# Patient Record
Sex: Female | Born: 1985 | Race: White | Hispanic: No | Marital: Single | Smoking: Never smoker
Health system: Southern US, Community
[De-identification: ages and names within clinical notes are randomized; demographics above are authoritative.]

---
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
## 2005-11-01 ENCOUNTER — Other Ambulatory Visit: Admission: RE | Admit: 2005-11-01 | Discharge: 2005-11-01 | Payer: Self-pay | Admitting: Gynecology

## 2007-03-25 ENCOUNTER — Other Ambulatory Visit: Admission: RE | Admit: 2007-03-25 | Discharge: 2007-03-25 | Payer: Self-pay | Admitting: Gynecology

## 2008-10-19 ENCOUNTER — Encounter: Admission: RE | Admit: 2008-10-19 | Discharge: 2008-10-19 | Payer: Self-pay | Admitting: Gynecology

## 2009-07-18 ENCOUNTER — Encounter: Admission: RE | Admit: 2009-07-18 | Discharge: 2009-07-18 | Payer: Self-pay | Admitting: Emergency Medicine

## 2009-08-29 HISTORY — PX: CHOLECYSTECTOMY: SHX55

## 2011-04-04 IMAGING — US US ABDOMEN COMPLETE
1 series · 14 of 25 positions shown · non-contrast
Comparison: None

CLINICAL DATA: Chest pain.

ABDOMINAL ULTRASOUND COMPLETE

[Series 1: us abdomen complete · 0.28mm/px · 14 of 61 slices shown]
[im 1/61]
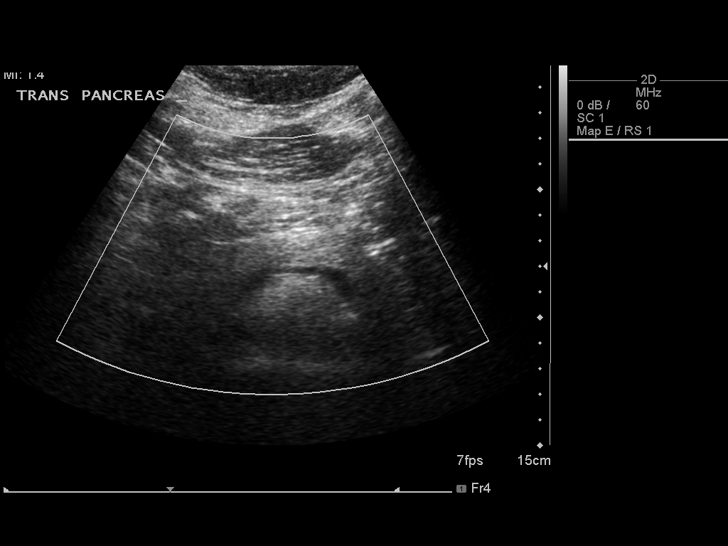
[im 6/61]
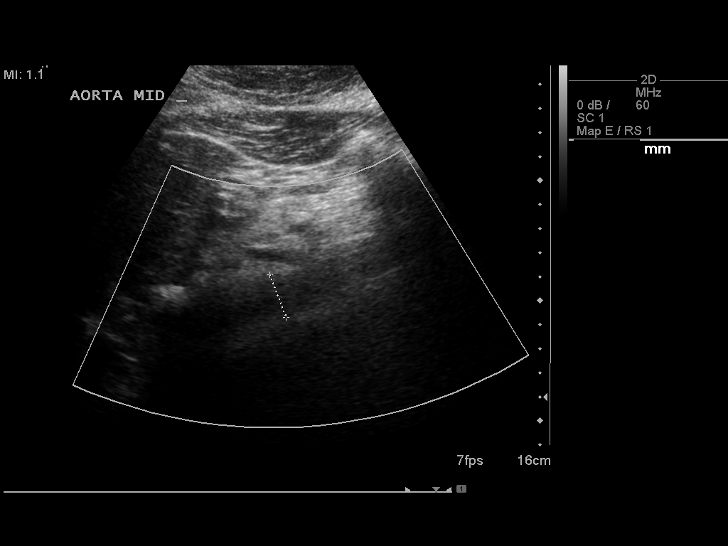
[im 11/61]
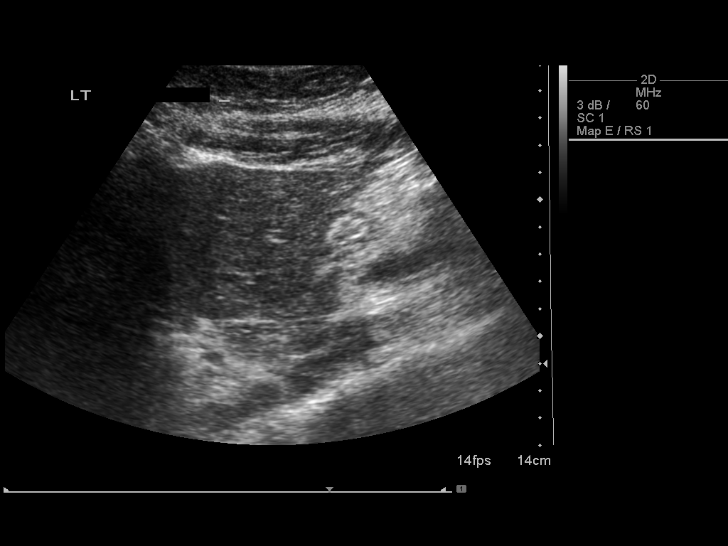
[im 16/61]
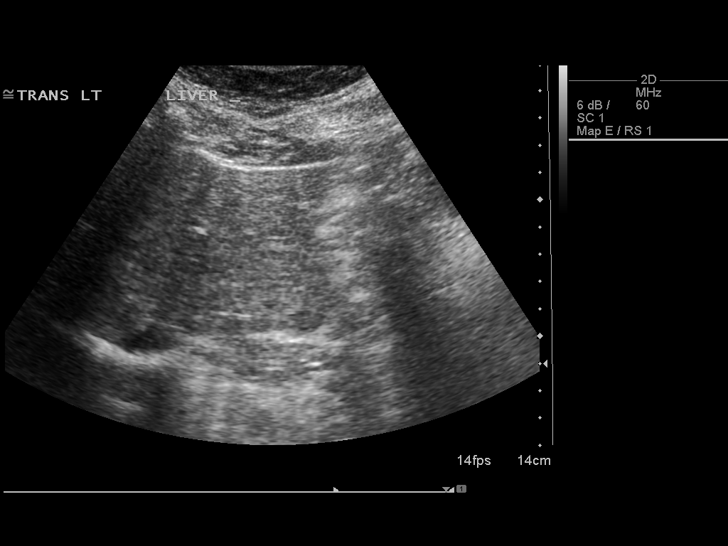
[im 21/61]
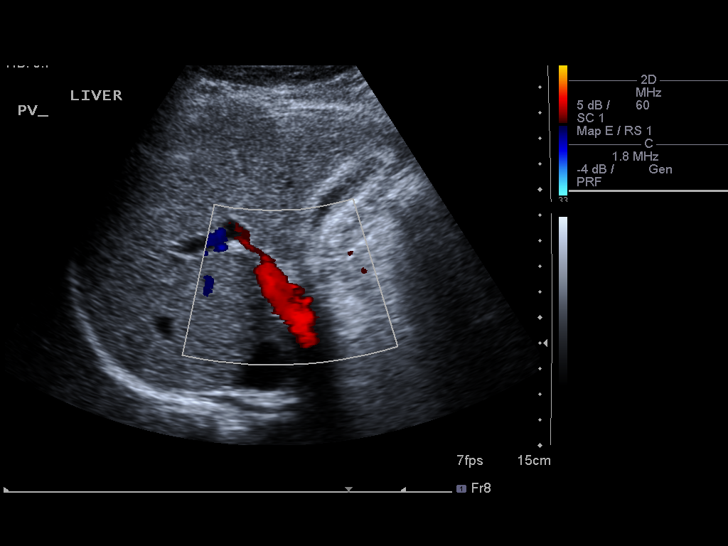
[im 23/61]
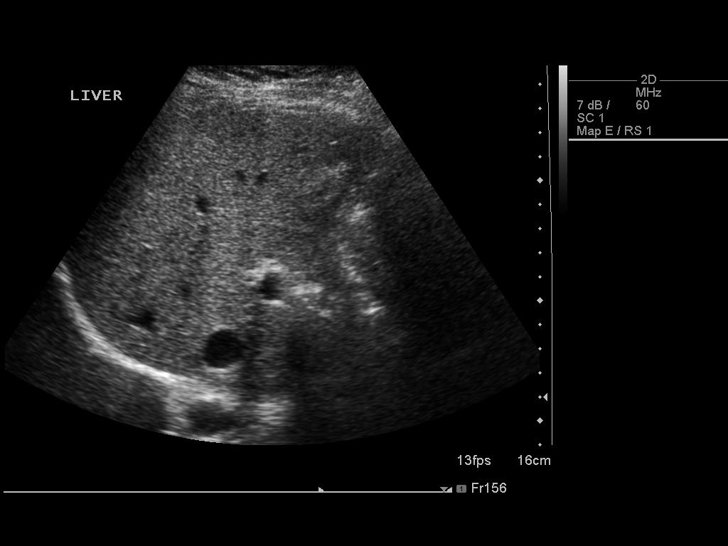
[im 28/61]
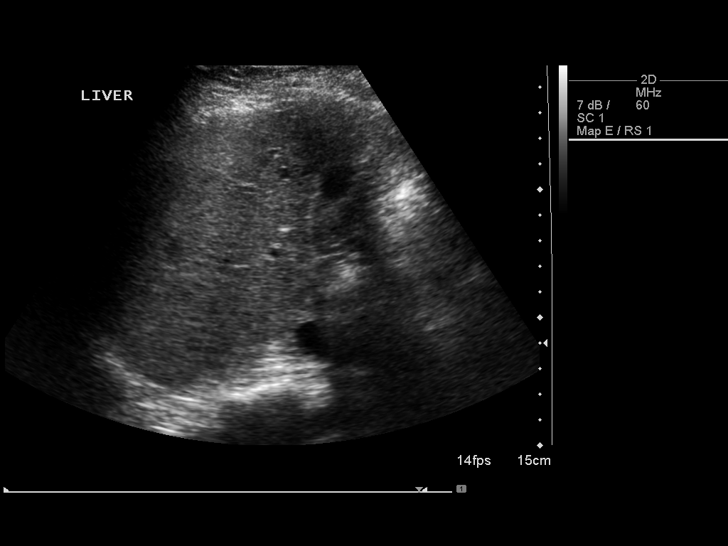
[im 33/61]
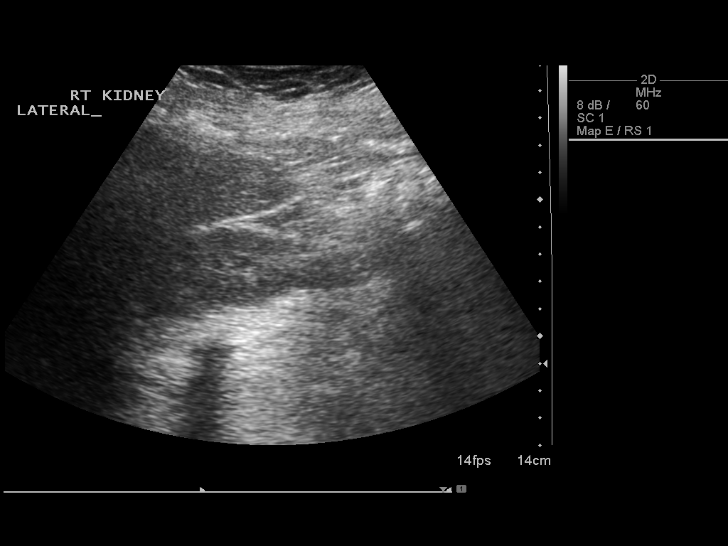
[im 38/61]
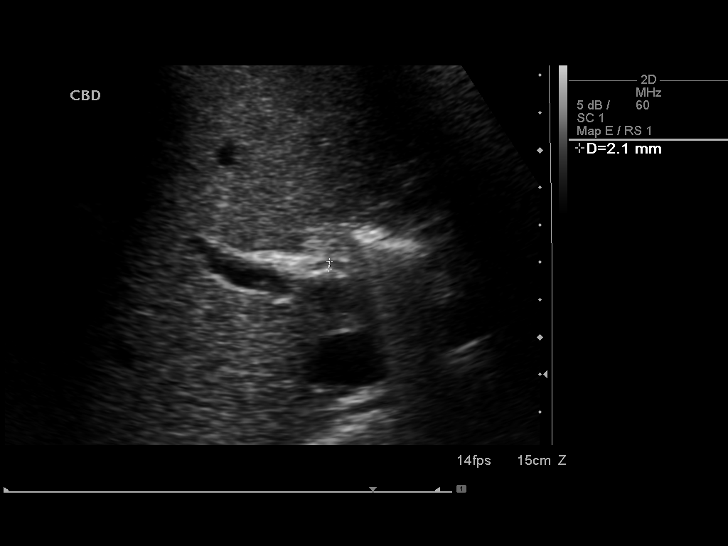
[im 41/61]
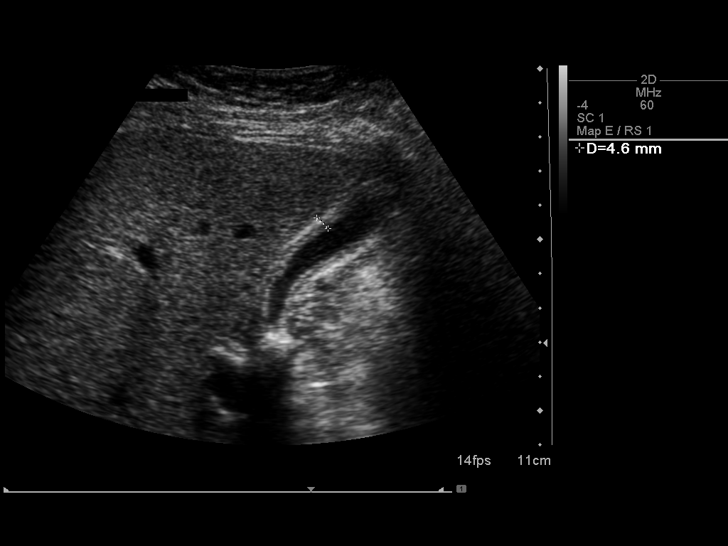
[im 46/61]
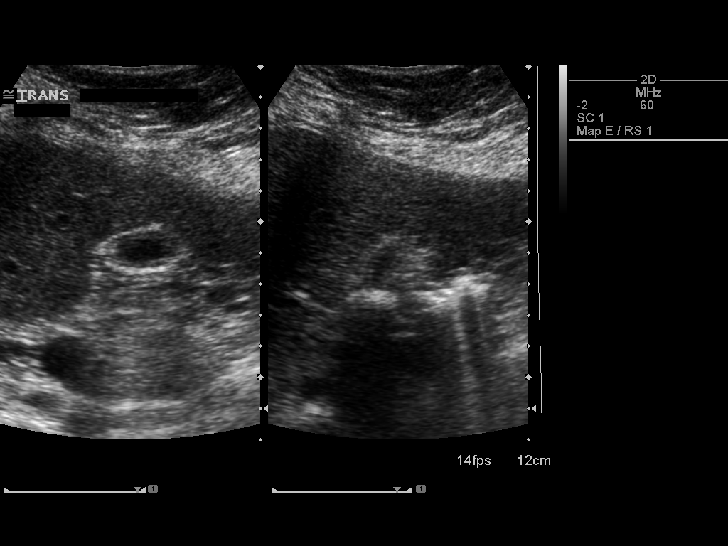
[im 51/61]
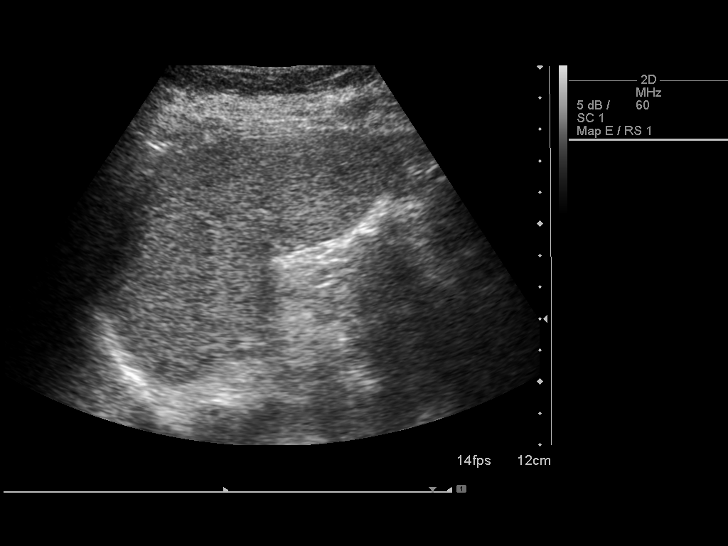
[im 56/61]
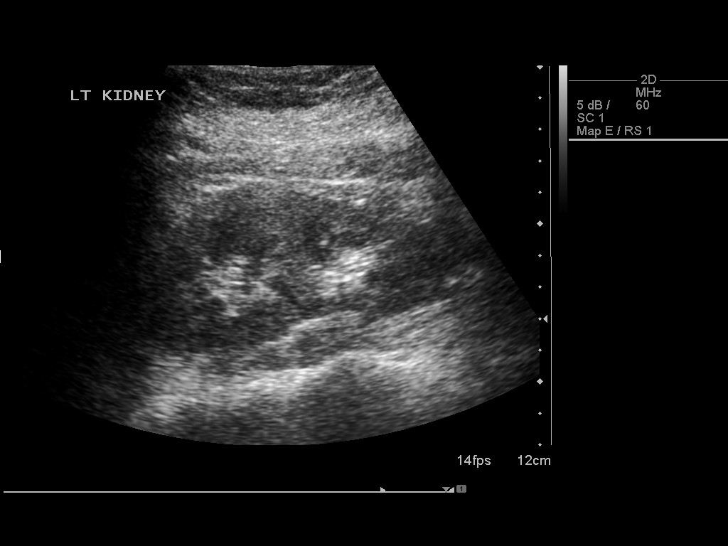
[im 61/61]
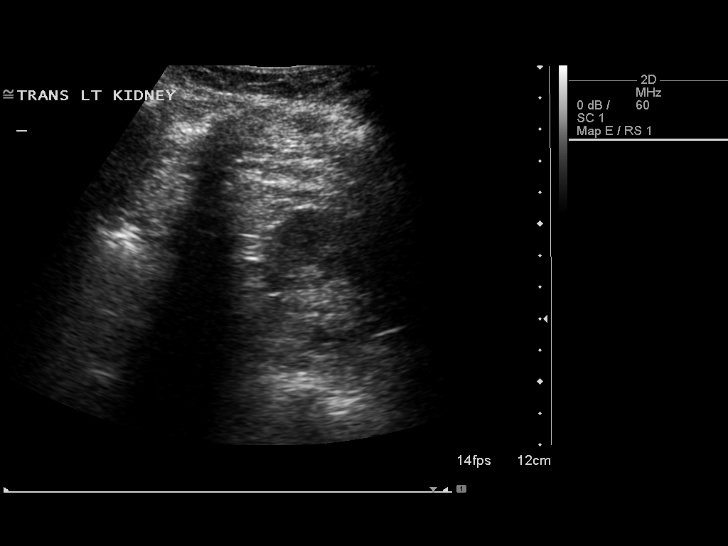

[14 of 25 positions shown; findings below may reference images not displayed]

FINDINGS: Gallbladder: There is a 1.6 cm gallstone within the neck of the
gallbladder.  The stone would not move out of the neck.  Negative
Murphy's sign.  Maximum gallbladder wall thickness is 4.6 mm.

Common Bile Duct:  Within normal limits in caliber.  Common duct
measures 2.1 mm in diameter.

Liver:  No focal lesion identified.  Within normal limits in
parenchymal echogenicity.

IVC:  Appears normal.

Pancreas:  Although the pancreas is difficult to visualize in its
entirety, no focal pancreatic abnormality is identified.

Spleen:  Within normal limits in size and echotexture.  7.2 cm
length

Right kidney:  Normal in size and parenchymal echogenicity.  No
evidence of mass or hydronephrosis.   10.0 cm length

Left kidney:  Normal in size and parenchymal echogenicity.  No
evidence of mass or hydronephrosis.  9.8 cm length

Abdominal Aorta:  No aneurysm identified.  1.9 cm maximum diameter.
IMPRESSION: Gallstones.Stone may be impacted in the neck.  Negative ultrasound
Murphy's sign.

## 2011-12-26 ENCOUNTER — Telehealth: Payer: Self-pay

## 2011-12-26 NOTE — Telephone Encounter (Signed)
Patient needs a refill on her adderall.  Please call patient when ready for pick up.  Thank you!   [phone]

## 2011-12-27 ENCOUNTER — Telehealth: Payer: Self-pay

## 2011-12-27 NOTE — Telephone Encounter (Signed)
Pt needs an OV before more refills are given.

## 2011-12-27 NOTE — Telephone Encounter (Signed)
LMOM to RTC for her refill

## 2012-01-01 ENCOUNTER — Encounter: Payer: Self-pay | Admitting: Family Medicine

## 2012-01-01 ENCOUNTER — Ambulatory Visit (INDEPENDENT_AMBULATORY_CARE_PROVIDER_SITE_OTHER): Payer: BC Managed Care – PPO | Admitting: Family Medicine

## 2012-01-01 VITALS — BP 124/83 | HR 119 | Temp 98.2°F | Resp 22 | Ht 63.3 in | Wt 144.8 lb

## 2012-01-01 DIAGNOSIS — R000 Tachycardia, unspecified: Secondary | ICD-10-CM

## 2012-01-01 DIAGNOSIS — D649 Anemia, unspecified: Secondary | ICD-10-CM

## 2012-01-01 DIAGNOSIS — Z79899 Other long term (current) drug therapy: Secondary | ICD-10-CM

## 2012-01-01 DIAGNOSIS — F988 Other specified behavioral and emotional disorders with onset usually occurring in childhood and adolescence: Secondary | ICD-10-CM | POA: Insufficient documentation

## 2012-01-01 MED ORDER — AMPHETAMINE-DEXTROAMPHET ER 20 MG PO CP24: 20.0000 mg | ORAL_CAPSULE | ORAL | Status: DC

## 2012-01-01 NOTE — Progress Notes (Signed)
  Subjective:    Patient ID: Kelsey [name], female    DOB: Dec 22, 1985, 26 y.o.   MRN: [id number]  HPI   This 26 y.o. Cauc female has ADD, diagnosed in childhood. Current dose Adderall 20 mg  is taken 1/2 tab q AM and then 1/2 tab in early afternoon yet this does not seem to last into the evening.   The pt works two jobs and needs dosing that will sustain her into the early evening. Otherwise, she is doing well.  She has a healthy appetite and tries to get adequate sleep.    Review of Systems  Noncontributory     Objective:   Physical Exam  Nursing note and vitals reviewed. Constitutional: She is oriented to person, place, and time. She appears well-developed and well-nourished. No distress.  HENT:  Head: Normocephalic and atraumatic.  Eyes: Conjunctivae and EOM are normal. No scleral icterus.  Neck: Normal range of motion. Neck supple. No thyromegaly present.  Pulmonary/Chest: Effort normal. No respiratory distress.  Abdominal: Soft. Bowel sounds are normal.  Lymphadenopathy:    She has no cervical adenopathy.  Neurological: She is alert and oriented to person, place, and time.  Skin: Skin is warm and dry.  Psychiatric: She has a normal mood and affect. Her behavior is normal. Thought content normal.          Assessment & Plan:   1. ADD (attention deficit disorder)  Rx; Adderall XR 20 mg  1 cap every AM; if this dose  And formulation change does not last until early evening, then will increase dose to Adderall XR 25 mg daily  2. Tachycardia  Monitor; limit caffeine

## 2012-01-01 NOTE — Patient Instructions (Addendum)
Your Adderall dose has been changed to Adderall XR 20 mg  1 capsule every AM. If you find that this seems to wear off too early, contact the office and leave a message with the nurse who will get that information to my attention.  I can then increase the dose to Adderall XR 25 mg.which should be more effective.

## 2012-01-02 LAB — COMPREHENSIVE METABOLIC PANEL
ALT: 10 U/L (ref 0–35)
AST: 19 U/L (ref 0–37)
Albumin: 3.7 g/dL (ref 3.5–5.2)
Alkaline Phosphatase: 51 U/L (ref 39–117)
BUN: 15 mg/dL (ref 6–23)
CO2: 24 mEq/L (ref 19–32)
Calcium: 8.8 mg/dL (ref 8.4–10.5)
Chloride: 106 mEq/L (ref 96–112)
Creat: 0.69 mg/dL (ref 0.50–1.10)
Glucose, Bld: 122 mg/dL — ABNORMAL HIGH (ref 70–99)
Potassium: 4.4 mEq/L (ref 3.5–5.3)
Sodium: 137 mEq/L (ref 135–145)
Total Bilirubin: 0.2 mg/dL — ABNORMAL LOW (ref 0.3–1.2)
Total Protein: 6.2 g/dL (ref 6.0–8.3)

## 2012-01-02 LAB — CBC
HCT: 34.2 % — ABNORMAL LOW (ref 36.0–46.0)
Hemoglobin: 10.8 g/dL — ABNORMAL LOW (ref 12.0–15.0)
MCH: 26 pg (ref 26.0–34.0)
MCHC: 31.6 g/dL (ref 30.0–36.0)
MCV: 82.4 fL (ref 78.0–100.0)
Platelets: 386 10*3/uL (ref 150–400)
RBC: 4.15 MIL/uL (ref 3.87–5.11)
RDW: 13.4 % (ref 11.5–15.5)
WBC: 5.4 10*3/uL (ref 4.0–10.5)

## 2012-01-02 NOTE — Progress Notes (Signed)
Review of labs shows pt is anemic; have ordered following labs-  Iron, TIBC, ferritin, Retic count.  This may explain Tachycardia evident at last visit.

## 2012-01-02 NOTE — Progress Notes (Signed)
Addended by: Dow Adolph B on: 01/02/2012 01:39 PM   Modules accepted: Orders

## 2012-01-08 ENCOUNTER — Ambulatory Visit: Payer: Self-pay | Admitting: Family Medicine

## 2012-02-05 NOTE — Progress Notes (Signed)
Quick Note:  Please call pt and advise that the following labs are abnormal... Notify pt that I had ordered follow-up labs pertaining to Anemia ( below normal Hemoglobin) but tests were not done. Her blood sugar was also elevated and these labs should be rechecked within the next month or two. She may benefit from taking a multi-vitamin for women that has iron in it [employer] Made" is a good brand; take the vitamin with largest meal of the day for good absorption)   Provide her with a copy of her labs. ______

## 2012-02-07 ENCOUNTER — Telehealth: Payer: Self-pay

## 2012-02-07 NOTE — Telephone Encounter (Signed)
PT STATES SHE WOULD LIKE TO HAVE HER ADDERALL DOSAGE CHANGED TO A HIGHER MGS IF POSSIBLE PLEASE CALL [phone]  WAS TOLD BY DR IF SHE NEEDED TO DO THAT, SHE WOULD TAKE CARE OF IT

## 2012-02-08 NOTE — Telephone Encounter (Signed)
Dr. Audria Nine, please see the note below for your attention.

## 2012-02-12 MED ORDER — AMPHETAMINE-DEXTROAMPHET ER 25 MG PO CP24: 25.0000 mg | ORAL_CAPSULE | ORAL | Status: DC

## 2012-02-12 NOTE — Telephone Encounter (Signed)
I have changed the dose of Adderall XR to 25 mg 24 hr capsule  1 capsule every AM and discontinued the 20 mg capsule. The printed RX will be at [address] [facility] front desk for pt to pick up. I tried to call her at 5:45 PM  Tuesday but her voicemail did not take messages.

## 2012-02-13 ENCOUNTER — Other Ambulatory Visit: Payer: Self-pay | Admitting: Family Medicine

## 2012-02-13 MED ORDER — AMPHETAMINE-DEXTROAMPHET ER 25 MG PO CP24: 25.0000 mg | ORAL_CAPSULE | ORAL | Status: DC

## 2012-02-13 NOTE — Telephone Encounter (Signed)
Patient notified Rx ready for pick up at [address].  Placed at front desk.

## 2012-03-17 ENCOUNTER — Telehealth: Payer: Self-pay

## 2012-03-17 NOTE — Telephone Encounter (Signed)
PT REQUESTING ADDERALL REFILL    BEST PHONE [phone]

## 2012-03-19 MED ORDER — AMPHETAMINE-DEXTROAMPHET ER 25 MG PO CP24: 25.0000 mg | ORAL_CAPSULE | ORAL | Status: DC

## 2012-03-19 NOTE — Telephone Encounter (Signed)
RX refilled  

## 2012-03-19 NOTE — Telephone Encounter (Signed)
Notified pt rx ready at front desk.

## 2012-04-18 ENCOUNTER — Other Ambulatory Visit: Payer: Self-pay

## 2012-04-18 NOTE — Telephone Encounter (Signed)
Pt requesting a rx refill for adderall please contact when ready for pick-up

## 2012-04-19 MED ORDER — AMPHETAMINE-DEXTROAMPHET ER 25 MG PO CP24: 25.0000 mg | ORAL_CAPSULE | ORAL | Status: DC

## 2012-04-19 NOTE — Telephone Encounter (Signed)
RX signed and ready at TL desk

## 2012-04-20 NOTE — Telephone Encounter (Signed)
LMOM to pick up RX

## 2012-05-22 ENCOUNTER — Telehealth: Payer: Self-pay

## 2012-05-22 NOTE — Telephone Encounter (Signed)
Kelsey [name] would like her Adderall prescription refilled. She is wondering wether she needs an appointment to get a refill. [facility] [address]  Best [phone]

## 2012-05-23 MED ORDER — AMPHETAMINE-DEXTROAMPHET ER 25 MG PO CP24: 25.0000 mg | ORAL_CAPSULE | ORAL | Status: DC

## 2012-05-23 NOTE — Telephone Encounter (Signed)
Signed at TL desk.  

## 2012-05-23 NOTE — Telephone Encounter (Signed)
PT NOTIFIED THAT RX IS READY

## 2012-06-25 ENCOUNTER — Telehealth: Payer: Self-pay

## 2012-06-25 MED ORDER — AMPHETAMINE-DEXTROAMPHET ER 25 MG PO CP24: 25.0000 mg | ORAL_CAPSULE | ORAL | Status: DC

## 2012-06-25 NOTE — Telephone Encounter (Signed)
Rx done and ready for pickup 

## 2012-06-25 NOTE — Telephone Encounter (Signed)
Pt would like refill on her adderall

## 2012-06-25 NOTE — Telephone Encounter (Signed)
[facility] notifying patient rx ready for pick up.

## 2012-08-07 ENCOUNTER — Telehealth: Payer: Self-pay

## 2012-08-07 NOTE — Telephone Encounter (Signed)
Pt requesting refill on her adderall

## 2012-08-07 NOTE — Telephone Encounter (Signed)
Called patient she is due for follow up, she does not have an appt, she is going to call for an appt. Tomorrow, she is also advised she can come in to urgent care center if appt can not be made. I have advised her she needs to be checked, at last visit she had some tachycardia.

## 2012-08-08 MED ORDER — AMPHETAMINE-DEXTROAMPHET ER 25 MG PO CP24: 25.0000 mg | ORAL_CAPSULE | ORAL | Status: DC

## 2012-08-08 NOTE — Telephone Encounter (Signed)
Notified pt Rx is ready and reminded her she needs OV bf next RF. Pt agreed and thanked [place].

## 2012-08-08 NOTE — Telephone Encounter (Signed)
Rx done and ready for pickup. Will need office visit for more

## 2013-03-03 ENCOUNTER — Other Ambulatory Visit: Payer: Self-pay | Admitting: Family Medicine

## 2013-03-03 ENCOUNTER — Ambulatory Visit: Payer: BC Managed Care – PPO | Admitting: Family Medicine

## 2013-03-03 MED ORDER — AMPHETAMINE-DEXTROAMPHET ER 25 MG PO CP24: 25.0000 mg | ORAL_CAPSULE | ORAL | Status: DC

## 2013-03-03 NOTE — Progress Notes (Signed)
Pt was 15 minutes late for appointment on 03/03/13. She received one refill on medication (Adderall XR 25 mg- generic) and advised to reschedule follow-up.

## 2013-04-02 ENCOUNTER — Other Ambulatory Visit: Payer: Self-pay | Admitting: Family Medicine

## 2013-04-02 ENCOUNTER — Ambulatory Visit (INDEPENDENT_AMBULATORY_CARE_PROVIDER_SITE_OTHER): Payer: BC Managed Care – PPO | Admitting: Family Medicine

## 2013-04-02 ENCOUNTER — Encounter: Payer: Self-pay | Admitting: Family Medicine

## 2013-04-02 VITALS — BP 96/60 | HR 74 | Temp 98.3°F | Resp 16 | Ht 63.0 in | Wt 151.8 lb

## 2013-04-02 DIAGNOSIS — R739 Hyperglycemia, unspecified: Secondary | ICD-10-CM

## 2013-04-02 DIAGNOSIS — F988 Other specified behavioral and emotional disorders with onset usually occurring in childhood and adolescence: Secondary | ICD-10-CM

## 2013-04-02 DIAGNOSIS — D649 Anemia, unspecified: Secondary | ICD-10-CM | POA: Insufficient documentation

## 2013-04-02 DIAGNOSIS — R7309 Other abnormal glucose: Secondary | ICD-10-CM

## 2013-04-02 LAB — CBC WITH DIFFERENTIAL/PLATELET
Basophils Absolute: 0.1 10*3/uL (ref 0.0–0.1)
Basophils Relative: 1 % (ref 0–1)
Eosinophils Absolute: 0.1 10*3/uL (ref 0.0–0.7)
Eosinophils Relative: 1 % (ref 0–5)
HCT: 33 % — ABNORMAL LOW (ref 36.0–46.0)
Hemoglobin: 10.4 g/dL — ABNORMAL LOW (ref 12.0–15.0)
MCH: 20.6 pg — ABNORMAL LOW (ref 26.0–34.0)
MCHC: 31.5 g/dL (ref 30.0–36.0)
MCV: 65.3 fL — ABNORMAL LOW (ref 78.0–100.0)
Monocytes Absolute: 0.4 10*3/uL (ref 0.1–1.0)
Monocytes Relative: 9 % (ref 3–12)
RDW: 19.2 % — ABNORMAL HIGH (ref 11.5–15.5)

## 2013-04-02 LAB — COMPREHENSIVE METABOLIC PANEL
Albumin: 3.9 g/dL (ref 3.5–5.2)
Alkaline Phosphatase: 72 U/L (ref 39–117)
Chloride: 105 mEq/L (ref 96–112)
Glucose, Bld: 80 mg/dL (ref 70–99)
Potassium: 4.5 mEq/L (ref 3.5–5.3)
Sodium: 140 mEq/L (ref 135–145)
Total Protein: 6.7 g/dL (ref 6.0–8.3)

## 2013-04-02 MED ORDER — AMPHETAMINE-DEXTROAMPHET ER 25 MG PO CP24: 25.0000 mg | ORAL_CAPSULE | ORAL | Status: DC

## 2013-04-02 NOTE — Progress Notes (Signed)
S:  This 27 y.o. 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 She has not taken the medication daily, taking a "drug holiday" when not working or at other times. She is productive and focused when taking medication. Pt denies adverse effects such as HA, dizziness, nausea or loss of appetite, CP or tightness, tremor, behavior changes or sleep or mood disturbance.   Patient Active Problem List   Diagnosis Date Noted  . Anemia 04/02/2013  . ADD (attention deficit disorder) 01/01/2012   PMHx, Soc Hx and Fam Hx reviewed.  ROS: As per HPI. Otherwise noncontributory.  O: Filed Vitals:   04/02/13 0946  BP: 96/60  Pulse: 74  Temp: 98.3 F (36.8 C)  Resp: 16   GEN: In NAD; WN,WD. HENT: [place]/AT; EOMI w/ clear conj/ sclerae. EACs/nose/oroph normal. COR: RRR. No m/g/r. LUNGS: CTA; no wheezes. Normal resp effort. ABD: Flat, soft. Nontender. No guarding. No masses or HSM. SKIN: W&D. No rashes or pallor. NEURO: A&O x 3; CNs intact. Nonfocal.  A/P: ADD (attention deficit disorder)- stable on current dose w/o side effects. Refill Adderall XR (generic) 25 mg x 3 months.  Anemia - Plan: CBC with Differential (repeat after abnormal CBC last year; pt did not return for additional labs as planned).  Hyperglycemia - elevated blood sugar March 2013 on CMET.  Plan: Comprehensive metabolic panel

## 2013-04-03 NOTE — Progress Notes (Signed)
Quick Note:  Please contact pt and advise that the following labs are abnormal... Labs show you are chronically anemic. I am adding some additional test to check iron level, etc. Improved nutrition, especially iron-rich foods and other healthy food choices as well as a Multivitamin with iron for Young Women (One-A-Day or [employer] Made are 2 good brands) can be helpful.  All other labs and chemistries, kidney and liver function are normal.  Copy to pt. ______

## 2013-04-04 LAB — IRON AND TIBC: UIBC: 456 ug/dL — ABNORMAL HIGH (ref 125–400)

## 2013-04-04 LAB — RETICULOCYTES
ABS Retic: 36.1 10*3/uL (ref 19.0–186.0)
RBC.: 5.15 MIL/uL — ABNORMAL HIGH (ref 3.87–5.11)

## 2013-04-07 ENCOUNTER — Other Ambulatory Visit: Payer: Self-pay | Admitting: Family Medicine

## 2013-04-07 MED ORDER — POLYSACCHARIDE IRON COMPLEX 150 MG PO CAPS: ORAL_CAPSULE | ORAL | Status: DC

## 2013-04-07 NOTE — Progress Notes (Signed)
Quick Note:  Please contact pt and advise that the following labs are abnormal... Pt is anemic; I am prescribing an iron supplement (Nu -Iron 150 mg) that needs to be taken daily. Nutrition needs to include more iron-rich foods; good basic overall improvement on nutrition is advised.  Copy to pt. ______

## 2013-08-14 ENCOUNTER — Telehealth: Payer: Self-pay | Admitting: *Deleted

## 2013-08-14 ENCOUNTER — Telehealth: Payer: Self-pay

## 2013-08-14 MED ORDER — AMPHETAMINE-DEXTROAMPHET ER 25 MG PO CP24: 25.0000 mg | ORAL_CAPSULE | ORAL | Status: DC

## 2013-08-14 NOTE — Telephone Encounter (Signed)
Pt needs refill on adderall. Pt doesn't know if she needs to be seen or if one can be written. Call at [phone].

## 2013-08-14 NOTE — Telephone Encounter (Signed)
Phoned patient to inform her that her Rx's for Adderall are ready for pickup. Pt advised to schedule an appt with Dr. Audria Nine in 3 months before additional refills can be prescribed.

## 2013-08-14 NOTE — Telephone Encounter (Signed)
Adderall prescriptions for 3 months printed and need to be picked up at [facility]. Pt needs to be seen in 3 months. Staff person at [address] to contact pt.

## 2013-10-30 ENCOUNTER — Encounter: Payer: Self-pay | Admitting: Family Medicine

## 2013-10-30 ENCOUNTER — Ambulatory Visit (INDEPENDENT_AMBULATORY_CARE_PROVIDER_SITE_OTHER): Payer: BC Managed Care – PPO | Admitting: Family Medicine

## 2013-10-30 VITALS — BP 106/68 | HR 70 | Temp 98.1°F | Resp 16 | Ht 64.0 in | Wt 152.4 lb

## 2013-10-30 DIAGNOSIS — F988 Other specified behavioral and emotional disorders with onset usually occurring in childhood and adolescence: Secondary | ICD-10-CM

## 2013-10-30 DIAGNOSIS — H109 Unspecified conjunctivitis: Secondary | ICD-10-CM

## 2013-10-30 MED ORDER — AMPHETAMINE-DEXTROAMPHET ER 25 MG PO CP24: 25.0000 mg | ORAL_CAPSULE | ORAL | Status: DC

## 2013-10-30 MED ORDER — OLOPATADINE HCL 0.2 % OP SOLN: 1.0000 [drp] | Freq: Every day | OPHTHALMIC | Status: DC

## 2013-10-30 NOTE — Patient Instructions (Signed)
Eye irritation- schedule an appointment with your eye care specialist.  Use the eye drops as directed (1 drop in each eye once a day).  For refills on Adderall- you have 3 months of refills; if everything is stable and you are having no problems with the medication, contact the office for another 3 months of prescriptions without office visit. I will need to see you again in 6 months for complete physical exam.

## 2013-10-30 NOTE — Progress Notes (Signed)
S:  This 28 y.o. Cauc female has ADD; she takes Adderall XR only as needed in anticipation of busy work schedule. She denies adverse effects (CP or tightness, palpitations, AH, dizziness, decreased appetite, sleep disturbance).  She c/o redness and watery eyes for ~ 6 weeks. She has no discharge or mattering and denies vision disturbance. She has not been wearing contacts due to irritation. Redness did resolve and she wore contacts once only to have irritation recur. She plans to schedule to see eye care specialist soon. Pt denies fever/chills, HA, sore throat, rhinorrhea or cough. She has dogs but is not allergic. She has had HVAC system in home cleaned recently.  Patient Active Problem List   Diagnosis Date Noted  . Anemia 04/02/2013  . ADD (attention deficit disorder) 01/01/2012   PMHx, Soc and Fam Hx reviewed. Medications reconciled.  ROS: As per HPI.  O: Filed Vitals:   10/30/13 0931  BP: 106/68  Pulse: 70  Temp: 98.1 F (36.7 C)  Resp: 16   GEN: In NAD: WN,WD. HEENT: /AT; EOMI w/ injected conj and clear sclerae. Watery eyes. EACs/TMs/ nasal mucosa normal. Post ph mildly erythematous w/o exudate or lesions. NECK: Supple w/o LAN or TMG. COR: RRR. LUNGS: Unlabored resp. SKIN: W&D; intact w/o diaphoresis, erythema, rashes or pallor. NEURO: A&O x 3; CNs intact. Nonfocal.  A/P: Conjunctivitis unspecified- Trial of Pataday ophthal eye drops. Do not wear contacts for at least 2 weeks.  ADD (attention deficit disorder)- Refill Adderall XR for 3 months.  Meds ordered this encounter  Medications  . Olopatadine HCl (PATADAY) 0.2 % SOLN    Sig: Apply 1 drop to eye daily.    Dispense:  1 Bottle    Refill:  2  . DISCONTD: amphetamine-dextroamphetamine (ADDERALL XR) 25 MG 24 hr capsule    Sig: Take 1 capsule (25 mg total) by mouth every morning.    Dispense:  30 capsule    Refill:  0    Do not fill before Nov 12, 2013.  Marland Kitchen. DISCONTD: amphetamine-dextroamphetamine (ADDERALL XR) 25  MG 24 hr capsule    Sig: Take 1 capsule (25 mg total) by mouth every morning.    Dispense:  30 capsule    Refill:  0    Do not fill before Dec 13, 2013.  Marland Kitchen. amphetamine-dextroamphetamine (ADDERALL XR) 25 MG 24 hr capsule    Sig: Take 1 capsule (25 mg total) by mouth every morning.    Dispense:  30 capsule    Refill:  0    Do not fill before January 10, 2014.  '

## 2014-03-12 ENCOUNTER — Telehealth: Payer: Self-pay

## 2014-03-12 NOTE — Telephone Encounter (Signed)
REFILL  amphetamine-dextroamphetamine (ADDERALL XR) 25 MG 24 hr capsule  [phone]

## 2014-03-13 MED ORDER — AMPHETAMINE-DEXTROAMPHET ER 25 MG PO CP24: 25.0000 mg | ORAL_CAPSULE | ORAL | Status: DC

## 2014-03-13 NOTE — Telephone Encounter (Signed)
Adderall XR 25 mg (generic) RXs for 3 months printed; pt to pick up at [facility]. Signature required. Office visit required for additional refills.

## 2014-03-14 NOTE — Telephone Encounter (Signed)
LMOM that rx's are ready for p/u and that she'll need an OV in 3 months for more

## 2014-07-29 ENCOUNTER — Telehealth: Payer: Self-pay

## 2014-07-29 NOTE — Telephone Encounter (Signed)
Pt called in and needs a refill for Adderall. She can be reached @ [phone]. Thank you

## 2014-07-30 MED ORDER — AMPHETAMINE-DEXTROAMPHET ER 25 MG PO CP24: 25.0000 mg | ORAL_CAPSULE | ORAL | Status: DC

## 2014-07-30 NOTE — Telephone Encounter (Signed)
Adderall XR 25 mg prescription for 3 months is at [address] for signature and pick-up. Please call pt and let her know we close at 5 today. Thanks.

## 2014-07-30 NOTE — Telephone Encounter (Signed)
Called to advise.  

## 2014-11-17 ENCOUNTER — Encounter: Payer: Self-pay | Admitting: Family Medicine

## 2014-11-17 ENCOUNTER — Ambulatory Visit (INDEPENDENT_AMBULATORY_CARE_PROVIDER_SITE_OTHER): Payer: Managed Care, Other (non HMO) | Admitting: Family Medicine

## 2014-11-17 VITALS — BP 100/70 | HR 85 | Temp 98.1°F | Resp 16 | Ht 63.5 in | Wt 153.6 lb

## 2014-11-17 DIAGNOSIS — Z79899 Other long term (current) drug therapy: Secondary | ICD-10-CM

## 2014-11-17 DIAGNOSIS — F909 Attention-deficit hyperactivity disorder, unspecified type: Secondary | ICD-10-CM

## 2014-11-17 DIAGNOSIS — H5789 Other specified disorders of eye and adnexa: Secondary | ICD-10-CM

## 2014-11-17 DIAGNOSIS — H578 Other specified disorders of eye and adnexa: Secondary | ICD-10-CM

## 2014-11-17 DIAGNOSIS — F988 Other specified behavioral and emotional disorders with onset usually occurring in childhood and adolescence: Secondary | ICD-10-CM

## 2014-11-17 LAB — COMPLETE METABOLIC PANEL WITH GFR
ALK PHOS: 75 U/L (ref 39–117)
ALT: 10 U/L (ref 0–35)
AST: 19 U/L (ref 0–37)
Albumin: 4.6 g/dL (ref 3.5–5.2)
BUN: 8 mg/dL (ref 6–23)
CALCIUM: 9.7 mg/dL (ref 8.4–10.5)
CHLORIDE: 100 meq/L (ref 96–112)
CO2: 28 mEq/L (ref 19–32)
Creat: 0.72 mg/dL (ref 0.50–1.10)
GFR, Est African American: >89 mL/min
Glucose, Bld: 93 mg/dL (ref 70–99)
Potassium: 4.1 mEq/L (ref 3.5–5.3)
SODIUM: 139 meq/L (ref 135–145)
Total Bilirubin: 0.5 mg/dL (ref 0.2–1.2)
Total Protein: 7.8 g/dL (ref 6.0–8.3)

## 2014-11-17 LAB — RHEUMATOID FACTOR: Rhuematoid fact SerPl-aCnc: <10 IU/mL (ref <=14)

## 2014-11-17 MED ORDER — AMPHETAMINE-DEXTROAMPHET ER 25 MG PO CP24: 25.0000 mg | ORAL_CAPSULE | ORAL | Status: DC

## 2014-11-17 NOTE — Progress Notes (Signed)
S:  This 29 y.o. Female is has an inflammatory eye condition being treated by Dr. Alois Cliche. She is here to day to have investigative lab tests ordered as part of work-up.  Pt reports that Dr. Idolina Primer has requested specific tests that are outlined in the records that have been faxed to [place]. (I do not have those records and they are not in medical records). Pt states that she feels well otherwise. She denies fever, fatigue, weight loss, anorexia, sore throat, rhinorrhea, cough, CP or tightness, SOB or DOE, cough, GI problems, rash, myalgias, arthralgias, HA, dizziness, numbness or weakness.   Pt signed ROI and most recent OV notes from obtained from Dr. Trish Fountain office. He is requesting work-up to r/o Sarcoid, Ulcerative Colitis and Rheumatoid Arthritis.   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  PMHx, SURG Hx, MEDICATIONS reviewed.  ROS: As per HPI.  O: Filed Vitals:   11/17/14 1126  BP: 100/70  Pulse: 85  Temp: 98.1 F (36.7 C)  Resp: 16   GEN: In NAD; WN,WD. HENT: [place]/AT; EOMI w/ clear conj/sclerae. Wears corrective lenses. Otherwise unremarkable. COR: RRR. LUNGS: Normal resp rate and effort. SKIN; W&D; intact w/o rash, erythema or pallor. NEURO: A&O x 3; CNs intact. Nonfocal.  A/P: Inflammation of eye  ADD (attention deficit disorder)  Encounter for long-term (current) drug use- RF Adderall   I reviewed notes from Dr. Trish Fountain office- labs requested include HLA-B27, ESR, ANA, CBC, Rheumatoid factor and lab for Sarcoid (ACE level).  Lab results will be faxed to Dr. Trish Fountain office- FAX # [phone]. Address- [address]   Meds ordered this encounter  Medications  . amphetamine-dextroamphetamine (ADDERALL XR) 25 MG 24 hr capsule    Sig: Take 1 capsule by mouth every morning.    Dispense:  30 capsule    Refill:  0  . amphetamine-dextroamphetamine (ADDERALL XR) 25 MG 24 hr capsule    Sig: Take 1 capsule by mouth  every morning.    Dispense:  30 capsule    Refill:  0    May fill on or after Dec 18, 2014.  Marland Kitchen amphetamine-dextroamphetamine (ADDERALL XR) 25 MG 24 hr capsule    Sig: Take 1 capsule by mouth every morning.    Dispense:  30 capsule    Refill:  0    May fill on or after January 16, 2015.

## 2014-11-17 NOTE — Patient Instructions (Signed)
I will check Medical Records to find the information sent to us from Dr. Shea Evansunn. If I cannot find the records, I will contact his office for the information to direct what blood tests we should be ordering. I suspect we are looking for some type of auto-immune disorder.

## 2014-11-18 LAB — ANA: ANA: NEGATIVE

## 2014-11-18 LAB — CBC WITH DIFFERENTIAL/PLATELET
Basophils Absolute: 0.1 10*3/uL (ref 0.0–0.1)
Basophils Relative: 1 % (ref 0–1)
EOS ABS: 0.1 10*3/uL (ref 0.0–0.7)
Eosinophils Relative: 1 % (ref 0–5)
HCT: 43.8 % (ref 36.0–46.0)
Hemoglobin: 15.2 g/dL — ABNORMAL HIGH (ref 12.0–15.0)
LYMPHS ABS: 2.3 10*3/uL (ref 0.7–4.0)
Lymphocytes Relative: 31 % (ref 12–46)
MCH: 30.3 pg (ref 26.0–34.0)
MCHC: 34.7 g/dL (ref 30.0–36.0)
MCV: 87.4 fL (ref 78.0–100.0)
MONOS PCT: 4 % (ref 3–12)
MPV: 10.2 fL (ref 8.6–12.4)
Monocytes Absolute: 0.3 10*3/uL (ref 0.1–1.0)
Neutro Abs: 4.6 10*3/uL (ref 1.7–7.7)
Neutrophils Relative %: 63 % (ref 43–77)
PLATELETS: 379 10*3/uL (ref 150–400)
RBC: 5.01 MIL/uL (ref 3.87–5.11)
RDW: 13.3 % (ref 11.5–15.5)
WBC: 7.3 10*3/uL (ref 4.0–10.5)

## 2014-11-18 LAB — ANGIOTENSIN CONVERTING ENZYME: Angiotensin-Converting Enzyme: 37 U/L (ref 8–52)

## 2014-11-18 LAB — HLA-B27 ANTIGEN: DNA Result:: NOT DETECTED

## 2014-11-18 LAB — SEDIMENTATION RATE: Sed Rate: 1 mm/hr (ref 0–22)

## 2014-11-24 ENCOUNTER — Encounter: Payer: Self-pay | Admitting: Family Medicine

## 2014-11-24 NOTE — Progress Notes (Signed)
Quick Note:  Please advise pt regarding following labs... All recent labs requested by the eye specialist are unremarkable. The tests for autoimmune disorders are negative or normal.  We can fax a copy of these results to Dr. Telford Nabunn's office. (FAX #: [phone])  Contact the clinic if you have any questions or concerns.  Copy to pt. ______

## 2014-12-02 ENCOUNTER — Telehealth: Payer: Self-pay

## 2014-12-02 NOTE — Telephone Encounter (Signed)
Pt LM on lab VM about labs.  Letter was sent and pt received it yesterday

## 2015-04-18 ENCOUNTER — Telehealth: Payer: Self-pay

## 2015-04-18 DIAGNOSIS — F988 Other specified behavioral and emotional disorders with onset usually occurring in childhood and adolescence: Secondary | ICD-10-CM

## 2015-04-18 MED ORDER — AMPHETAMINE-DEXTROAMPHET ER 25 MG PO CP24: 25.0000 mg | ORAL_CAPSULE | ORAL | Status: DC

## 2015-04-18 NOTE — Telephone Encounter (Signed)
Rx in pick up draw. Pt notified on voicemail.

## 2015-04-18 NOTE — Telephone Encounter (Signed)
Refilled 1 month of Adderall. Pt last seen January of 2016. She will need OV to establish care with new provider here, since Dr. Audria Nine retired, in order to get more refills.

## 2015-04-18 NOTE — Telephone Encounter (Signed)
Pt requesting Adderal refill   Best phone for pt is [phone]

## 2015-05-23 ENCOUNTER — Ambulatory Visit (INDEPENDENT_AMBULATORY_CARE_PROVIDER_SITE_OTHER): Payer: Managed Care, Other (non HMO) | Admitting: Family Medicine

## 2015-05-23 ENCOUNTER — Encounter: Payer: Self-pay | Admitting: Family Medicine

## 2015-05-23 VITALS — BP 122/83 | HR 96 | Temp 98.2°F | Resp 16 | Ht 63.0 in | Wt 150.0 lb

## 2015-05-23 DIAGNOSIS — F988 Other specified behavioral and emotional disorders with onset usually occurring in childhood and adolescence: Secondary | ICD-10-CM

## 2015-05-23 DIAGNOSIS — Z23 Encounter for immunization: Secondary | ICD-10-CM | POA: Diagnosis not present

## 2015-05-23 DIAGNOSIS — Z862 Personal history of diseases of the blood and blood-forming organs and certain disorders involving the immune mechanism: Secondary | ICD-10-CM | POA: Diagnosis not present

## 2015-05-23 DIAGNOSIS — F909 Attention-deficit hyperactivity disorder, unspecified type: Secondary | ICD-10-CM | POA: Diagnosis not present

## 2015-05-23 LAB — CBC
HEMATOCRIT: 41.9 % (ref 36.0–46.0)
Hemoglobin: 14.1 g/dL (ref 12.0–15.0)
MCH: 29.7 pg (ref 26.0–34.0)
MCHC: 33.7 g/dL (ref 30.0–36.0)
MCV: 88.2 fL (ref 78.0–100.0)
MPV: 10.3 fL (ref 8.6–12.4)
PLATELETS: 319 10*3/uL (ref 150–400)
RBC: 4.75 MIL/uL (ref 3.87–5.11)
RDW: 13.2 % (ref 11.5–15.5)
WBC: 6 10*3/uL (ref 4.0–10.5)

## 2015-05-23 MED ORDER — AMPHETAMINE-DEXTROAMPHET ER 25 MG PO CP24: 25.0000 mg | ORAL_CAPSULE | ORAL | Status: DC

## 2015-05-23 NOTE — Patient Instructions (Signed)
Please let me know when you need further RF of your addeall- we can plan to recheck here in about 6 months.   Let me know about a month prior to your trip to [place] and I can call in medication for typhoid and malaria for you if needed

## 2015-05-23 NOTE — Progress Notes (Signed)
[facility] [address] [place] [address] [phone]  Date:  05/23/2015   Name:  Kelsey [name]   DOB:  [birth year]   MRN:  [id number]  PCP:  No primary care provider on file.    Chief Complaint: Medication Refill   History of Present Illness:  Kelsey [name] is a 29 y.o. very pleasant female patient who presents with the following:  History of ADHD treated with adderall. Former Manufacturing engineer pt- now retired. She takes adderall 25 xr once a day She has been on this tx since college.   She feels that she is doing ok with this dose. She dose not take it on the weekend.  She does better with the xr than she did on the IR She works in Consulting civil engineer.  She is otherwise in good health  Not planning any pregnancy soon She takes the adderall early in the am so it does not keep her up at night  She plans a trip to [place] this fall and would like to get started on any needed immunizations.  She does not think that she has had hep a series or a recent tetanus  Patient Active Problem List   Diagnosis Date Noted  . Anemia 04/02/2013  . ADD (attention deficit disorder) 01/01/2012    No past medical history on file.  Past Surgical History  Procedure Laterality Date  . Cholecystectomy  Nov 2010    Gallstones    History  Substance Use Topics  . Smoking status: Never Smoker   . Smokeless tobacco: Not on file  . Alcohol Use: Yes    Family History  Problem Relation Age of Onset  . Cancer Mother     ovarian  . Hypertension Father     No Known Allergies  Medication list has been reviewed and updated.  Current Outpatient Prescriptions on File Prior to Visit  Medication Sig Dispense Refill  . amphetamine-dextroamphetamine (ADDERALL XR) 25 MG 24 hr capsule Take 1 capsule by mouth every morning. 30 capsule 0  . amphetamine-dextroamphetamine (ADDERALL XR) 25 MG 24 hr capsule Take 1 capsule by mouth every morning. 30 capsule 0  .  amphetamine-dextroamphetamine (ADDERALL XR) 25 MG 24 hr capsule Take 1 capsule by mouth every morning. 30 capsule 0   No current facility-administered medications on file prior to visit.    Review of Systems:  As per HPI- otherwise negative.   Physical Examination: Filed Vitals:   05/23/15 1128  BP: 122/83  Pulse: 96  Temp: 98.2 F (36.8 C)  Resp: 16   Filed Vitals:   05/23/15 1128  Height:  (1.6 m)  Weight: 150 lb (68.04 kg)   Body mass index is 26.58 kg/(m^2). Ideal Body Weight: Weight in (lb) to have BMI = 25: 140.8  GEN: WDWN, NAD, Non-toxic, A & O x 3, mild overweight, looks well HEENT: Atraumatic, Normocephalic. Neck supple. No masses, No LAD. Ears and Nose: No external deformity. CV: RRR, No M/G/R. No JVD. No thrill. No extra heart sounds. PULM: CTA B, no wheezes, crackles, rhonchi. No retractions. No resp. distress. No accessory muscle use. EXTR: No c/c/e NEURO Normal gait.  PSYCH: Normally interactive. Conversant. Not depressed or anxious appearing.  Calm demeanor.    Assessment and Plan: ADD (attention deficit disorder) - Plan: amphetamine-dextroamphetamine (ADDERALL XR) 25 MG 24 hr capsule, amphetamine-dextroamphetamine (ADDERALL XR) 25 MG 24 hr capsule, amphetamine-dextroamphetamine (ADDERALL XR) 25 MG 24 hr capsule  Immunization due - Plan: Hepatitis A vaccine  adult IM, Tdap vaccine greater than or equal to 7yo IM  History of anemia - Plan: CBC  Refilled her adderall xr 25 mg which she takes most days Planned trip to [place]- gave hep a #1, tdap today Counseled to contact me for typhoid and malaria prophylaxis   Signed Abbe Amsterdam, MD

## 2015-05-26 ENCOUNTER — Telehealth: Payer: Self-pay

## 2015-05-26 NOTE — Telephone Encounter (Signed)
Patient is calling because she took her adderrall prescription to the pharmacy and the pharmacy requested additional information that need to be verified from our office. Patient doesn't know exactly what they were asking for and said it would be best for [place] to call the pharmacy. Pharmacy is [facility] [address] Patient also requested that she get a call back. Patient phone: [phone]

## 2015-05-27 ENCOUNTER — Encounter: Payer: Self-pay | Admitting: Family Medicine

## 2015-05-27 NOTE — Telephone Encounter (Signed)
Pt needs a PA on her Adderall. Called the pharmacy and they gave me the information. Please call. [phone] ID [id number]

## 2015-05-27 NOTE — Telephone Encounter (Signed)
PA approved, see notes under 7/28 phone mes.

## 2015-05-27 NOTE — Telephone Encounter (Signed)
Ins called and asked me some add'l questions on the phone. PA approved for 36 mos. Notified pt and pharm.

## 2015-05-27 NOTE — Telephone Encounter (Signed)
Completed PA on covermymeds. Pending. Pt aware on MyChart.

## 2015-07-06 ENCOUNTER — Ambulatory Visit: Payer: Managed Care, Other (non HMO) | Admitting: Family Medicine

## 2015-09-07 ENCOUNTER — Ambulatory Visit (INDEPENDENT_AMBULATORY_CARE_PROVIDER_SITE_OTHER): Payer: Managed Care, Other (non HMO) | Admitting: Family Medicine

## 2015-09-07 ENCOUNTER — Encounter: Payer: Self-pay | Admitting: Family Medicine

## 2015-09-07 VITALS — BP 108/76 | HR 103 | Temp 98.4°F | Resp 16 | Ht 64.0 in | Wt 146.4 lb

## 2015-09-07 DIAGNOSIS — F988 Other specified behavioral and emotional disorders with onset usually occurring in childhood and adolescence: Secondary | ICD-10-CM

## 2015-09-07 DIAGNOSIS — F909 Attention-deficit hyperactivity disorder, unspecified type: Secondary | ICD-10-CM | POA: Diagnosis not present

## 2015-09-07 DIAGNOSIS — Z7184 Encounter for health counseling related to travel: Secondary | ICD-10-CM

## 2015-09-07 DIAGNOSIS — Z7189 Other specified counseling: Secondary | ICD-10-CM

## 2015-09-07 MED ORDER — AMPHETAMINE-DEXTROAMPHET ER 25 MG PO CP24: 25.0000 mg | ORAL_CAPSULE | ORAL | Status: DC

## 2015-09-07 MED ORDER — ATOVAQUONE-PROGUANIL HCL 250-100 MG PO TABS: 1.0000 | ORAL_TABLET | Freq: Every day | ORAL | Status: DC

## 2015-09-07 MED ORDER — TYPHOID VACCINE PO CPDR: 1.0000 | DELAYED_RELEASE_CAPSULE | ORAL | Status: DC

## 2015-09-07 NOTE — Progress Notes (Signed)
[facility] [address], [place] [id number] [phone]  Date:  09/07/2015   Name:  Kelsey [name]   DOB:  12-Dec-1985   MRN:  [id number]  PCP:  No primary care provider on file.    Chief Complaint: Follow-up; meds for international travel; and Medication Refill   History of Present Illness:  Kelsey [name] is a 29 y.o. very pleasant female patient who presents with the following:  Here today to follow-up regarding her trip to [place] that is coming up on 10/20- she will be in country for 2 weeks.  [place] will be near [place] the entire time and not heading significantly north She was here in July - we gave her first hep A shot then, also tetanus booster She believes she has had hep B series already and does not plan to engage in any risky behaviors  She declines a flu shot today  She is stable on her dose of adderall and would like to do refills while she is here today She is SA but uses condoms for contraception- does not expect pregnancy   Patient Active Problem List   Diagnosis Date Noted  . Anemia 04/02/2013  . ADD (attention deficit disorder) 01/01/2012    No past medical history on file.  Past Surgical History  Procedure Laterality Date  . Cholecystectomy  Nov 2010    Gallstones    Social History  Substance Use Topics  . Smoking status: Never Smoker   . Smokeless tobacco: None  . Alcohol Use: Yes    Family History  Problem Relation Age of Onset  . Cancer Mother     ovarian  . Hypertension Father     No Known Allergies  Medication list has been reviewed and updated.  Current Outpatient Prescriptions on File Prior to Visit  Medication Sig Dispense Refill  . amphetamine-dextroamphetamine (ADDERALL XR) 25 MG 24 hr capsule Take 1 capsule by mouth every morning. Fill 60 days after rx 30 capsule 0  . amphetamine-dextroamphetamine (ADDERALL XR) 25 MG 24 hr capsule Take 1 capsule by mouth every morning. Fill 30 days  after rx 30 capsule 0  . amphetamine-dextroamphetamine (ADDERALL XR) 25 MG 24 hr capsule Take 1 capsule by mouth every morning. 30 capsule 0   No current facility-administered medications on file prior to visit.    Review of Systems:  As per HPI- otherwise negative.   Physical Examination: Filed Vitals:   09/07/15 1630  BP: 108/76  Pulse: 103  Temp: 98.4 F (36.9 C)  Resp: 16   Filed Vitals:   09/07/15 1630  Height: 5\' 4"  (1.626 m)  Weight: 146 lb 6.4 oz (66.407 kg)   Body mass index is 25.12 kg/(m^2). Ideal Body Weight: Weight in (lb) to have BMI = 25: 145.3  GEN: WDWN, NAD, Non-toxic, A & O x 3, looks well HEENT: Atraumatic, Normocephalic. Neck supple. No masses, No LAD. Ears and Nose: No external deformity. CV: RRR, No M/G/R. No JVD. No thrill. No extra heart sounds.  Slightly tachycardia- due to adderall.  She denies any SOB PULM: CTA B, no wheezes, crackles, rhonchi. No retractions. No resp. distress. No accessory muscle use. ABD: S, NT, ND, +BS. No rebound. No HSM. EXTR: No c/c/e NEURO Normal gait.  PSYCH: Normally interactive. Conversant. Not depressed or anxious appearing.  Calm demeanor.    Assessment and Plan: Travel advice encounter - Plan: typhoid (VIVOTIF) DR capsule, atovaquone-proguanil (MALARONE) 250-100 MG TABS tablet  ADD (attention  deficit disorder) - Plan: amphetamine-dextroamphetamine (ADDERALL XR) 25 MG 24 hr capsule, amphetamine-dextroamphetamine (ADDERALL XR) 25 MG 24 hr capsule, amphetamine-dextroamphetamine (ADDERALL XR) 25 MG 24 hr capsule  Planning a trip to [place] rx for vivotif and malarone for malaria prophylaxis.  We discussed the pros and possible cons of these medications; she is not entirely sure that she wants to take them but did take the rx Refilled her adderall  Signed Abbe Amsterdam, MD

## 2016-01-23 ENCOUNTER — Telehealth: Payer: Self-pay

## 2016-01-23 NOTE — Telephone Encounter (Signed)
Pt is needing a refill on adderall  Please call [phone]

## 2016-01-24 NOTE — Telephone Encounter (Signed)
Dr. Copland pt 

## 2016-01-26 ENCOUNTER — Telehealth: Payer: Self-pay

## 2016-01-26 DIAGNOSIS — F988 Other specified behavioral and emotional disorders with onset usually occurring in childhood and adolescence: Secondary | ICD-10-CM

## 2016-01-26 NOTE — Telephone Encounter (Signed)
Pt is checking on status of her adderall refill request -she is going out of town tomorrow and needs this refill  Best number [phone]

## 2016-01-27 MED ORDER — AMPHETAMINE-DEXTROAMPHET ER 25 MG PO CP24: 25.0000 mg | ORAL_CAPSULE | ORAL | Status: DC

## 2016-01-27 NOTE — Telephone Encounter (Signed)
Rxs printed.  She needs to be seen for the next set-either here with another provider, or establish with Dr. Patsy Lageropland at [facility].  Meds ordered this encounter  Medications  . amphetamine-dextroamphetamine (ADDERALL XR) 25 MG 24 hr capsule    Sig: Take 1 capsule by mouth every morning. Fill 60 days after rx    Dispense:  30 capsule    Refill:  0    Order Specific Question:  Supervising Provider    Answer:  DOOLITTLE, ROBERT P [[id number]]  . amphetamine-dextroamphetamine (ADDERALL XR) 25 MG 24 hr capsule    Sig: Take 1 capsule by mouth every morning. Fill 30 days after rx    Dispense:  30 capsule    Refill:  0    Order Specific Question:  Supervising Provider    Answer:  DOOLITTLE, ROBERT P [[id number]]  . amphetamine-dextroamphetamine (ADDERALL XR) 25 MG 24 hr capsule    Sig: Take 1 capsule by mouth every morning.    Dispense:  30 capsule    Refill:  0    .    Order Specific Question:  Supervising Provider    Answer:  Ellamae SiaOLITTLE, ROBERT P [[id number]]

## 2016-01-27 NOTE — Telephone Encounter (Signed)
In pick up draw. 

## 2016-04-27 ENCOUNTER — Other Ambulatory Visit: Payer: Self-pay

## 2016-04-27 DIAGNOSIS — F988 Other specified behavioral and emotional disorders with onset usually occurring in childhood and adolescence: Secondary | ICD-10-CM

## 2016-04-27 NOTE — Telephone Encounter (Signed)
Patient is calling to request a refill for adderral. [phone]

## 2016-04-29 NOTE — Telephone Encounter (Signed)
Chelle, I have pended one RF, but you haven't seen pt since Nov. Does pt need to RTC for a RF?

## 2016-04-30 NOTE — Telephone Encounter (Signed)
Notified pt about need for f/up. She prefers to est w/a new provider here. I explained our new same/next day appts and transferred her to operator to try to schedule an appt.

## 2016-04-30 NOTE — Telephone Encounter (Signed)
Yes, patient needs to be seen. Either here, and establish with another provider, or with Dr. Patsy Lageropland at her new office.

## 2016-05-02 ENCOUNTER — Ambulatory Visit (INDEPENDENT_AMBULATORY_CARE_PROVIDER_SITE_OTHER): Payer: Managed Care, Other (non HMO) | Admitting: Physician Assistant

## 2016-05-02 VITALS — BP 122/70 | HR 94 | Temp 98.2°F | Resp 18 | Ht 64.0 in | Wt 144.0 lb

## 2016-05-02 DIAGNOSIS — F909 Attention-deficit hyperactivity disorder, unspecified type: Secondary | ICD-10-CM

## 2016-05-02 DIAGNOSIS — F988 Other specified behavioral and emotional disorders with onset usually occurring in childhood and adolescence: Secondary | ICD-10-CM

## 2016-05-02 MED ORDER — AMPHETAMINE-DEXTROAMPHET ER 25 MG PO CP24: 25.0000 mg | ORAL_CAPSULE | ORAL | Status: DC

## 2016-05-02 NOTE — Progress Notes (Signed)
   05/02/2016 3:53 PM   DOB: [birth year] / MRN: [id number]  SUBJECTIVE:  Kelsey [name] is a 30 y.o. female presenting for medication refills for ADD.  She was diagnosed as a child and reports her PCP "was Dr. Patsy Lageropland" and she would like to continue her care with her.    She has No Known Allergies.   She  has no past medical history on file.    She  reports that she has never smoked. She does not have any smokeless tobacco history on file. She reports that she drinks alcohol. She reports that she does not use illicit drugs. She  reports that she currently engages in sexual activity. She reports using the following method of birth control/protection: Condom. The patient  has past surgical history that includes Cholecystectomy (Nov 2010).  Her family history includes Cancer in her mother; Hypertension in her father.  Review of Systems  Constitutional: Negative for fever and chills.  Eyes: Negative for blurred vision.  Respiratory: Negative for cough and shortness of breath.   Cardiovascular: Negative for chest pain.  Gastrointestinal: Negative for nausea and abdominal pain.  Genitourinary: Negative for dysuria, urgency and frequency.  Musculoskeletal: Negative for myalgias.  Skin: Negative for rash.  Neurological: Negative for dizziness, tingling and headaches.  Psychiatric/Behavioral: Negative for depression. The patient is not nervous/anxious.     Problem list and medications reviewed and updated by myself where necessary, and exist elsewhere in the encounter.   OBJECTIVE:  BP 122/70 mmHg  Pulse 94  Temp(Src) 98.2 F (36.8 C) (Oral)  Resp 18  Ht 5\' 4"  (1.626 m)  Wt 144 lb (65.318 kg)  BMI 24.71 kg/m2  SpO2 99%  LMP 04/17/2016  Physical Exam  Constitutional: She is oriented to person, place, and time. She appears well-nourished. No distress.  Eyes: EOM are normal. Pupils are equal, round, and reactive to light.  Cardiovascular: Normal rate.   Pulmonary/Chest: Effort  normal.  Abdominal: She exhibits no distension.  Neurological: She is alert and oriented to person, place, and time. No cranial nerve deficit. Gait normal.  Skin: Skin is [name]. She is not diaphoretic.  Psychiatric: She has a normal mood and affect.  Vitals reviewed.    No results found.  ASSESSMENT AND PLAN  Kelsey [name] was seen today for medication refill.  Diagnoses and all orders for this visit:  ADD (attention deficit disorder): Thirty days of medication prescribed.  Advised that she present to her PCP Dr. Patsy Lageropland for future refills of this medication and for her primary care.      The patient was advised to call or return to clinic if she does not see an improvement in symptoms, or to seek the care of the closest emergency department if she worsens with the above plan.   Kelsey [name], MHS, PA-C [facility] 05/02/2016 3:53 PM

## 2016-05-02 NOTE — Patient Instructions (Signed)
     IF you received an x-ray today, you will receive an invoice from [facility]. Please contact [facility] at [phone] with questions or concerns regarding your invoice.   IF you received labwork today, you will receive an invoice from [facility]. Please contact [facility] at [phone] with questions or concerns regarding your invoice.   Our billing staff will not be able to assist you with questions regarding bills from these companies.  You will be contacted with the lab results as soon as they are available. The fastest way to get your results is to activate your My Chart account. Instructions are located on the last page of this paperwork. If you have not heard from us regarding the results in 2 weeks, please contact this office.      

## 2016-05-30 ENCOUNTER — Encounter: Payer: Self-pay | Admitting: Family Medicine

## 2016-05-30 ENCOUNTER — Ambulatory Visit (INDEPENDENT_AMBULATORY_CARE_PROVIDER_SITE_OTHER): Payer: Managed Care, Other (non HMO) | Admitting: Family Medicine

## 2016-05-30 DIAGNOSIS — F909 Attention-deficit hyperactivity disorder, unspecified type: Secondary | ICD-10-CM

## 2016-05-30 DIAGNOSIS — F988 Other specified behavioral and emotional disorders with onset usually occurring in childhood and adolescence: Secondary | ICD-10-CM

## 2016-05-30 MED ORDER — AMPHETAMINE-DEXTROAMPHET ER 25 MG PO CP24: 25.0000 mg | ORAL_CAPSULE | ORAL | 0 refills | Status: DC

## 2016-05-30 MED ORDER — AMPHETAMINE-DEXTROAMPHET ER 25 MG PO CP24: 25.0000 mg | ORAL_CAPSULE | ORAL | 0 refills | Status: AC

## 2016-05-30 NOTE — Patient Instructions (Signed)
It was good to see you today- take care and let me know when you need more medication

## 2016-05-30 NOTE — Progress Notes (Signed)
[facility] [address] [place], [place] [address] [phone]  Date:  05/30/2016   Name:  Kelsey [name]   DOB:  02/03/1986   MRN:  [id number]  PCP:  Abbe Amsterdam, MD    Chief Complaint: Medication Refill (Pt has not been seen since 08/2015 and has one week left of Add[place] and will be out of town for work next week. )   History of Present Illness:  Kelsey [name] is a 30 y.o. very pleasant female patient who presents with the following:  Here today for an adderall recheck. She has been stable on her current dose for some time She had last gotten her adderall from Mr. Chestine Spore, PA-C at [facility] just about one month ago She is doing well with her current dose.  She tends to go to bed late just because she gets busy doing things- she wants to try and get to bed sooner.  She takes the med around 7:30 am. She may get 6 hours of sleep a night.  her appetite is ok No palpitations or shakiness, but she will get a dry mouth sometimes, better with hydration  LMP 7/19  She takes the adderall during the days that she is working.     Patient Active Problem List   Diagnosis Date Noted  . Anemia 04/02/2013  . ADD (attention deficit disorder) 01/01/2012    No past medical history on file.  Past Surgical History:  Procedure Laterality Date  . CHOLECYSTECTOMY  Nov 2010   Gallstones    Social History  Substance Use Topics  . Smoking status: Never Smoker  . Smokeless tobacco: Not on file  . Alcohol use Yes    Family History  Problem Relation Age of Onset  . Cancer Mother     ovarian  . Hypertension Father     No Known Allergies  Medication list has been reviewed and updated.  Current Outpatient Prescriptions on File Prior to Visit  Medication Sig Dispense Refill  . amphetamine-dextroamphetamine (ADDERALL XR) 25 MG 24 hr capsule Take 1 capsule by mouth every morning. 30 capsule 0   No current  facility-administered medications on file prior to visit.     Review of Systems:  As per HPI- otherwise negative.   Physical Examination: Vitals:   05/30/16 1640  BP: 118/82  Pulse: 99  Temp: 98.7 F (37.1 C)   Vitals:   05/30/16 1640  Weight: 145 lb 12.8 oz (66.1 kg)  Height: 5\' 3"  (1.6 m)   Body mass index is 25.83 kg/m. Ideal Body Weight: Weight in (lb) to have BMI = 25: 140.8  GEN: WDWN, NAD, Non-toxic, A & O x 3, looks well HEENT: Atraumatic, Normocephalic. Neck supple. No masses, No LAD. Ears and Nose: No external deformity. CV: RRR, No M/G/R. No JVD. No thrill. No extra heart sounds. PULM: CTA B, no wheezes, crackles, rhonchi. No retractions. No resp. distress. No accessory muscle use. EXTR: No c/c/e NEURO Normal gait.  PSYCH: Normally interactive. Conversant. Not depressed or anxious appearing.  Calm demeanor.    Assessment and Plan: ADD (attention deficit disorder) - Plan: amphetamine-dextroamphetamine (ADDERALL XR) 25 MG 24 hr capsule, DISCONTINUED: amphetamine-dextroamphetamine (ADDERALL XR) 25 MG 24 hr capsule, DISCONTINUED: amphetamine-dextroamphetamine (ADDERALL XR) 25 MG 24 hr capsule  Refilled adderall for today, 30 and 60 d today Did drug screen today She will contact me for medication in 3 months, face to face visit in 6 months  Signed Lamar Blinks, MD

## 2016-05-30 NOTE — Progress Notes (Signed)
Pre visit review using our clinic review tool, if applicable. No additional management support is needed unless otherwise documented below in the visit note. 

## 2016-06-11 ENCOUNTER — Encounter: Payer: Self-pay | Admitting: Family Medicine

## 2016-06-25 ENCOUNTER — Other Ambulatory Visit: Payer: Self-pay | Admitting: Family Medicine

## 2016-07-29 DEATH — deceased
